# Patient Record
Sex: Female | Born: 1985 | Race: White | Hispanic: No | Marital: Single
Health system: Southern US, Community
[De-identification: ages and names within clinical notes are randomized; demographics above are authoritative.]

---
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
## 2020-01-11 ENCOUNTER — Ambulatory Visit: Payer: Self-pay | Attending: Internal Medicine

## 2020-01-11 DIAGNOSIS — Z23 Encounter for immunization: Secondary | ICD-10-CM

## 2020-01-11 NOTE — Progress Notes (Signed)
   [facility]ov[facility] Vaccination [facility]  Name:  Stephanie Nguyen    MRN: [id number] DOB: January 16, 1986  01/11/2020  [name] was observed post Covid-19 immunization for 15 minutes without incident. She was provided with Vaccine Information Sheet and instruction to access the V-Safe system.   [name] was instructed to call 911 with any severe reactions post vaccine: Marland Kitchen Difficulty breathing  . Swelling of face and throat  . A fast heartbeat  . A bad rash all over body  . Dizziness and weakness   Immunizations Administered    Name Date Dose VIS Date Route   Pfizer COVID-19 Vaccine 01/11/2020  2:58 PM 0.3 mL 09/20/2019 Intramuscular   Manufacturer: [employer], [employer]   Lot: [id number]   NDC: [address]-2

## 2020-02-04 ENCOUNTER — Ambulatory Visit: Payer: Self-pay | Attending: Internal Medicine

## 2020-05-04 ENCOUNTER — Ambulatory Visit: Payer: Self-pay | Attending: Internal Medicine

## 2020-05-04 DIAGNOSIS — Z23 Encounter for immunization: Secondary | ICD-10-CM

## 2020-05-04 NOTE — Progress Notes (Signed)
   Covid-19 Vaccination Clinic  Name:  Stephanie Nguyen    MRN: [id number] DOB: February 06, 1986  05/04/2020  [name] was observed post Covid-19 immunization for 15 minutes without incident. She was provided with Vaccine Information Sheet and instruction to access the V-Safe system.   [name] was instructed to call 911 with any severe reactions post vaccine: Marland Kitchen Difficulty breathing  . Swelling of face and throat  . A fast heartbeat  . A bad rash all over body  . Dizziness and weakness   Immunizations Administered    Name Date Dose VIS Date Route   Pfizer COVID-19 Vaccine 05/04/2020  2:03 PM 0.3 mL 12/04/2018 Intramuscular   Manufacturer: [employer], [employer]   Lot: [id number]   NDC: [address]-8
